# Patient Record
Sex: Male | Born: 1985 | Race: White | Hispanic: No | Marital: Single | Smoking: Never smoker
Health system: Southern US, Community
[De-identification: ages and names within clinical notes are randomized; demographics above are authoritative.]

## PROBLEM LIST (undated history)

## (undated) DIAGNOSIS — N200 Calculus of kidney: Secondary | ICD-10-CM

---

## 2009-04-23 ENCOUNTER — Emergency Department (HOSPITAL_COMMUNITY): Admission: EM | Admit: 2009-04-23 | Discharge: 2009-04-23 | Payer: Self-pay | Admitting: Emergency Medicine

## 2011-04-02 LAB — URINALYSIS, ROUTINE W REFLEX MICROSCOPIC
Glucose, UA: NEGATIVE mg/dL
Ketones, ur: NEGATIVE mg/dL
Protein, ur: 30 mg/dL — AB
pH: 7 (ref 5.0–8.0)

## 2011-04-02 LAB — URINE MICROSCOPIC-ADD ON

## 2018-04-06 ENCOUNTER — Emergency Department (HOSPITAL_COMMUNITY)
Admission: EM | Admit: 2018-04-06 | Discharge: 2018-04-06 | Disposition: A | Discharge status: Home / Self Care | Payer: 59 | Attending: Emergency Medicine | Admitting: Emergency Medicine

## 2018-04-06 ENCOUNTER — Other Ambulatory Visit: Payer: Self-pay

## 2018-04-06 ENCOUNTER — Encounter (HOSPITAL_COMMUNITY): Payer: Self-pay | Admitting: Emergency Medicine

## 2018-04-06 ENCOUNTER — Emergency Department (HOSPITAL_COMMUNITY): Payer: 59

## 2018-04-06 DIAGNOSIS — R109 Unspecified abdominal pain: Secondary | ICD-10-CM | POA: Diagnosis present

## 2018-04-06 DIAGNOSIS — N200 Calculus of kidney: Secondary | ICD-10-CM | POA: Insufficient documentation

## 2018-04-06 HISTORY — DX: Calculus of kidney: N20.0

## 2018-04-06 LAB — URINALYSIS, ROUTINE W REFLEX MICROSCOPIC
Bacteria, UA: NONE SEEN
Bilirubin Urine: NEGATIVE
GLUCOSE, UA: NEGATIVE mg/dL
Ketones, ur: NEGATIVE mg/dL
Leukocytes, UA: NEGATIVE
Nitrite: NEGATIVE
PH: 6 (ref 5.0–8.0)
Protein, ur: NEGATIVE mg/dL
Specific Gravity, Urine: 1.023 (ref 1.005–1.030)
Squamous Epithelial / LPF: NONE SEEN

## 2018-04-06 LAB — CBC WITH DIFFERENTIAL/PLATELET
Basophils Absolute: 0 10*3/uL (ref 0.0–0.1)
Basophils Relative: 0 %
Eosinophils Absolute: 0.1 10*3/uL (ref 0.0–0.7)
Eosinophils Relative: 1 %
HCT: 46 % (ref 39.0–52.0)
Hemoglobin: 15.5 g/dL (ref 13.0–17.0)
Lymphocytes Relative: 12 %
Lymphs Abs: 1.6 10*3/uL (ref 0.7–4.0)
MCH: 28.5 pg (ref 26.0–34.0)
MCHC: 33.7 g/dL (ref 30.0–36.0)
MCV: 84.6 fL (ref 78.0–100.0)
Monocytes Absolute: 1.3 10*3/uL — ABNORMAL HIGH (ref 0.1–1.0)
Monocytes Relative: 9 %
Neutro Abs: 11 10*3/uL — ABNORMAL HIGH (ref 1.7–7.7)
Neutrophils Relative %: 78 %
Platelets: 205 10*3/uL (ref 150–400)
RBC: 5.44 MIL/uL (ref 4.22–5.81)
RDW: 13.1 % (ref 11.5–15.5)
WBC: 14 10*3/uL — ABNORMAL HIGH (ref 4.0–10.5)

## 2018-04-06 LAB — BASIC METABOLIC PANEL
Anion gap: 10 (ref 5–15)
BUN: 14 mg/dL (ref 6–20)
CO2: 25 mmol/L (ref 22–32)
Calcium: 9.4 mg/dL (ref 8.9–10.3)
Chloride: 102 mmol/L (ref 101–111)
Creatinine, Ser: 1.21 mg/dL (ref 0.61–1.24)
GFR calc Af Amer: >60 mL/min (ref >60)
GFR calc non Af Amer: >60 mL/min (ref >60)
Glucose, Bld: 104 mg/dL — ABNORMAL HIGH (ref 65–99)
Potassium: 4.1 mmol/L (ref 3.5–5.1)
Sodium: 137 mmol/L (ref 135–145)

## 2018-04-06 MED ORDER — TAMSULOSIN HCL 0.4 MG PO CAPS: 0.4000 mg | ORAL_CAPSULE | Freq: Every day | ORAL | 0 refills | Status: DC

## 2018-04-06 MED ORDER — OXYCODONE-ACETAMINOPHEN 5-325 MG PO TABS: 1.0000 | ORAL_TABLET | Freq: Four times a day (QID) | ORAL | 0 refills | Status: DC | PRN

## 2018-04-06 MED ORDER — OXYCODONE-ACETAMINOPHEN 5-325 MG PO TABS
1.0000 | ORAL_TABLET | ORAL | Status: DC | PRN
  Administered 2018-04-06: 1 via ORAL
  Filled 2018-04-06: qty 1

## 2018-04-06 MED ORDER — KETOROLAC TROMETHAMINE 30 MG/ML IJ SOLN
30.0000 mg | Freq: Once | INTRAMUSCULAR | Status: AC
  Administered 2018-04-06: 30 mg via INTRAMUSCULAR
  Filled 2018-04-06: qty 1

## 2018-04-06 NOTE — ED Provider Notes (Signed)
[facility] Provider Note   CSN: [id number] Arrival date & time: 04/06/18  1231     History   Chief Complaint Chief Complaint  Patient presents with  . Flank Pain    HPI Todd Vazquez is a 32 y.o. male.  HPI   32 year old male with a history of nephrolithiasis presents today with complaints of flank pain.  Patient notes that last week he had left-sided flank pain blood in his urine and felt as if he had passed a stone.  Patient notes he was feeling better and then again had recurrence of symptoms this morning.  He notes this was severe in the left flank, denies any dysuria, but reports fullness in his bladder.  He denies any fever chills nausea or vomiting, denies any abdominal pain.  Patient notes this feels similar to previous history of stones.   Past Medical History:  Diagnosis Date  . Kidney calculi     There are no active problems to display for this patient.   History reviewed. No pertinent surgical history.      Home Medications    Prior to Admission medications   Medication Sig Start Date End Date Taking? Authorizing Provider  oxyCODONE-acetaminophen (PERCOCET/ROXICET) 5-325 MG tablet Take 1 tablet by mouth every 6 (six) hours as needed for severe pain. 04/06/18   Vanilla Heatherington, Tinnie GensJeffrey, PA-C  tamsulosin (FLOMAX) 0.4 MG CAPS capsule Take 1 capsule (0.4 mg total) by mouth daily. 04/06/18   Eyvonne MechanicHedges, Kalii Chesmore, PA-C    Family History No family history on file.  Social History Social History   Tobacco Use  . Smoking status: Never Smoker  Substance Use Topics  . Alcohol use: Never    Frequency: Never  . Drug use: Never     Allergies   Patient has no known allergies.   Review of Systems Review of Systems  All other systems reviewed and are negative.  Physical Exam Updated Vital Signs BP (!) 143/106   Pulse 82   Temp (!) 97.5 F (36.4 C) (Oral)   Resp 18   Ht 6\' 2"  (1.88 m)   Wt 127 kg (280 lb)   SpO2 98%   BMI  35.95 kg/m   Physical Exam  Constitutional: He is oriented to person, place, and time. He appears well-developed and well-nourished.  HENT:  Head: Normocephalic and atraumatic.  Eyes: Pupils are equal, round, and reactive to light. Conjunctivae are normal. Right eye exhibits no discharge. Left eye exhibits no discharge. No scleral icterus.  Neck: Normal range of motion. No JVD present. No tracheal deviation present.  Pulmonary/Chest: Effort normal. No stridor.  Abdominal: Soft. He exhibits no distension and no mass. There is no tenderness. There is no rebound and no guarding. No hernia.  No CVA tenderness  Neurological: He is alert and oriented to person, place, and time. Coordination normal.  Psychiatric: He has a normal mood and affect. His behavior is normal. Judgment and thought content normal.  Nursing note and vitals reviewed.    ED Treatments / Results  Labs (all labs ordered are listed, but only abnormal results are displayed) Labs Reviewed  URINALYSIS, ROUTINE W REFLEX MICROSCOPIC - Abnormal; Notable for the following components:      Result Value   APPearance HAZY (*)    Hgb urine dipstick LARGE (*)    All other components within normal limits  CBC WITH DIFFERENTIAL/PLATELET - Abnormal; Notable for the following components:   WBC 14.0 (*)    Neutro Abs  11.0 (*)    Monocytes Absolute 1.3 (*)    All other components within normal limits  BASIC METABOLIC PANEL - Abnormal; Notable for the following components:   Glucose, Bld 104 (*)    All other components within normal limits    EKG None  Radiology Ct Renal Stone Study  Result Date: 04/06/2018 CLINICAL DATA:  Left flank pain and dysuria EXAM: CT ABDOMEN AND PELVIS WITHOUT CONTRAST TECHNIQUE: Multidetector CT imaging of the abdomen and pelvis was performed following the standard protocol without oral or IV contrast. COMPARISON:  Apr 23, 2009 FINDINGS: Lower chest: Lung bases are clear. Hepatobiliary: There is hepatic  steatosis. No focal liver lesions are evident. Gallbladder wall is not appreciably thickened. There is no biliary duct dilatation. Pancreas: No pancreatic mass or inflammatory focus. Spleen: No splenic lesions are evident. Adrenals/Urinary Tract: Adrenals bilaterally appear normal. There is a horseshoe kidney. There is no renal mass. There is hydronephrosis of the left moiety of the horseshoe kidney. No hydronephrosis on the right. There is a 1 mm calculus in the left horseshoe kidney moiety superiorly. There is a 1 mm calculus more inferiorly in the left horseshoe kidney moiety. There is a 1 mm calculus in the lower portion of the right moiety region. There is ureterectasis on the left to the level of the ureteropelvic junction. At the proximal ureterovesical junction on the left, there is a 2 mm calculus. More distally in the ureterovesical junction region on the left, there is a 5 x 5 mm calculus. No other ureteral calculi are evident. Urinary bladder wall thickness is within normal limits. Stomach/Bowel: There is no appreciable bowel wall or mesenteric thickening. No evident bowel obstruction. No free air. Vascular/Lymphatic: No vascular lesion evident. No abdominal aortic aneurysm. No adenopathy is evident in the abdomen or pelvis. Reproductive: Prostate and seminal vesicles appear normal in size and contour. No pelvic mass evident. Other: Appendix appears normal. No abscess or ascites evident in the abdomen or pelvis. Musculoskeletal: There is an assimilation joint on the right at L5. No blastic or lytic bone lesions. No intramuscular or abdominal wall lesion evident. IMPRESSION: 1. Horseshoe kidney. Moderate hydronephrosis of the left renal moiety. There is also left-sided ureterectasis. At the ureterovesical junction distally on the left, there is a 5 x 5 mm calculus. More proximally at the left ureterovesical junction, there is a 2 mm calculus. 2. There are small nonobstructing calculi in each moiety of the  horseshoe kidney. 3.  No bowel obstruction.  No abscess.  Appendix appears normal. 4.  Hepatic steatosis.  No focal liver lesions evident. Electronically Signed   By: Bretta Bang III M.D.   On: 04/06/2018 15:21    Procedures Procedures (including critical care time)  Medications Ordered in ED Medications  oxyCODONE-acetaminophen (PERCOCET/ROXICET) 5-325 MG per tablet 1 tablet (1 tablet Oral Given 04/06/18 1343)  ketorolac (TORADOL) 30 MG/ML injection 30 mg (30 mg Intramuscular Given 04/06/18 1438)     Initial Impression / Assessment and Plan / ED Course  I have reviewed the triage vital signs and the nursing notes.  Pertinent labs & imaging results that were available during my care of the patient were reviewed by me and considered in my medical decision making (see chart for details).     Final Clinical Impressions(s) / ED Diagnoses   Final diagnoses:  Nephrolithiasis    Labs: CBC, BMP   Imaging: CT renal  Consults:  Therapeutics: Toradol, Percocet  Discharge Meds: Percocet, Flomax  Assessment/Plan: 32 year old  male presents today with ureterolithiasis.  Patient has 2 stones in the distal ureter close to the UVJ.  Patient has hydronephrosis, no signs of decreased GFR or signs of urinary tract infection.  Slight elevation in white count likely secondary to pain.  Abdomen soft nontender.  Patient's pain well controlled here, I do feel he is safe for outpatient follow-up, I have encouraged him to consult urology to schedule outpatient follow-up this week, he will return immediately with any new or worsening signs or symptoms including fever, nausea vomiting, decreased urine, pain, or any other concerning feature.  Both patient and his significant other verbalized understanding and agreement to today's plan had no further questions or concerns at the time of discharge.    ED Discharge Orders        Ordered    oxyCODONE-acetaminophen (PERCOCET/ROXICET) 5-325 MG tablet  Every  6 hours PRN     04/06/18 1559    tamsulosin (FLOMAX) 0.4 MG CAPS capsule  Daily     04/06/18 1601       Eyvonne Mechanic, PA-C 04/06/18 1643    Arby Barrette, MD 04/06/18 1811

## 2018-04-06 NOTE — Discharge Instructions (Signed)
Please read attached information. If you experience any new or worsening signs or symptoms please return to the emergency room for evaluation. Please follow-up with your primary care provider or specialist as discussed. Please use medication prescribed only as directed and discontinue taking if you have any concerning signs or symptoms.   °

## 2018-04-06 NOTE — ED Notes (Signed)
Patient transported to CT 

## 2018-04-06 NOTE — ED Triage Notes (Signed)
States history of kidney stones and passes them a lot.  Today approximately 2 hours ago developed left flank pain. 9/10 sharp. States does of dysuria burning sensation.

## 2018-04-06 NOTE — ED Provider Notes (Signed)
Patient placed in Quick Look pathway, seen and evaluated   Chief Complaint: Left flank pain   HPI:   Patient with a history of nephrolithiasis presenting with left flank pain radiating down the left groin, dysuria and urge to urinate but unable to void for the last 4 hours.  His pain is similar to prior stone. He has also passed a stone last week.  ROS: Denies testicular pain or swelling, no fever or chills, vomiting or diarrhea.  Physical Exam:   Gen: No distress  Neuro: Awake and Alert  Skin: Warm    Focused Exam: Afebrile, nontoxic-appearing, mild discomfort on palpation of the LLQ and lower back with left-sided CVA tenderness.   Initiation of care has begun. The patient has been counseled on the process, plan, and necessity for staying for the completion/evaluation, and the remainder of the medical screening examination    Todd Vazquez, Todd Zeek B, PA-C 04/06/18 1350    Todd Vazquez, Todd Lyn, MD 04/06/18 1538

## 2019-02-07 IMAGING — CT CT RENAL STONE PROTOCOL
2 of 4 series · 16 of 46 positions shown, 18 images · non-contrast
Comparison: April 23, 2009

CLINICAL DATA: Left flank pain and dysuria

EXAM:
CT ABDOMEN AND PELVIS WITHOUT CONTRAST
TECHNIQUE: Multidetector CT imaging of the abdomen and pelvis was performed
following the standard protocol without oral or IV contrast.

[Series 3: stone study 5.0 i30f 2 · axial · 0.98mm/px · z∈[+973,+1493]mm · 13 of 114 slices shown, 15 images]
[im 5/114  soft-tissue]
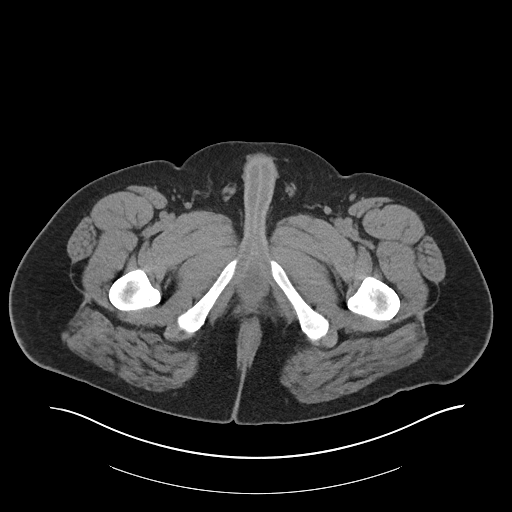
[im 5/114  bone]
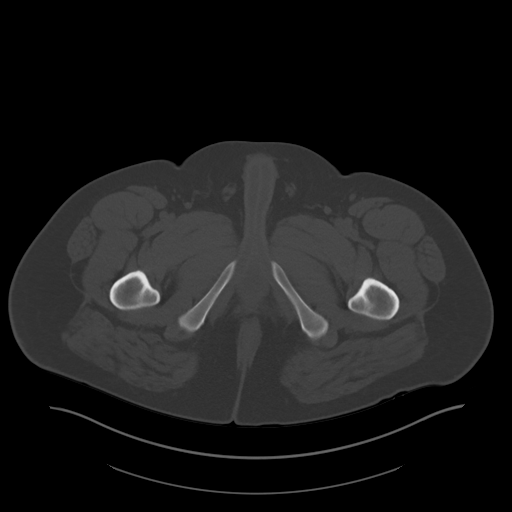
[im 15/114  soft-tissue]
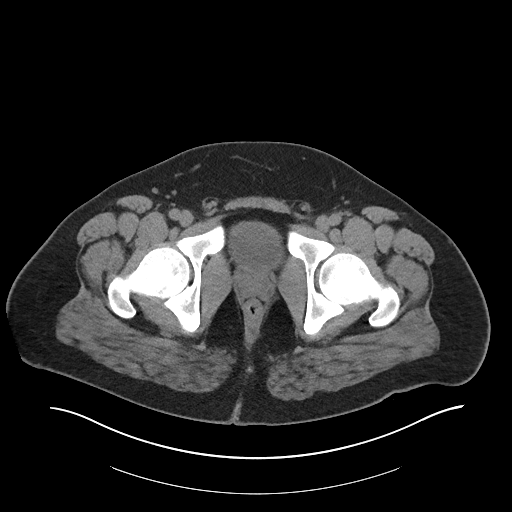
[im 25/114  soft-tissue]
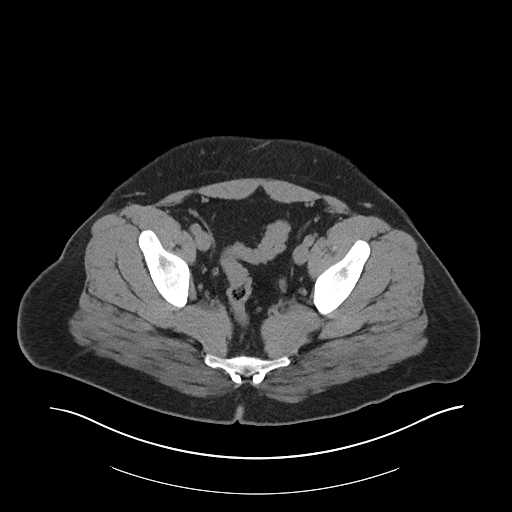
[im 30/114  soft-tissue]
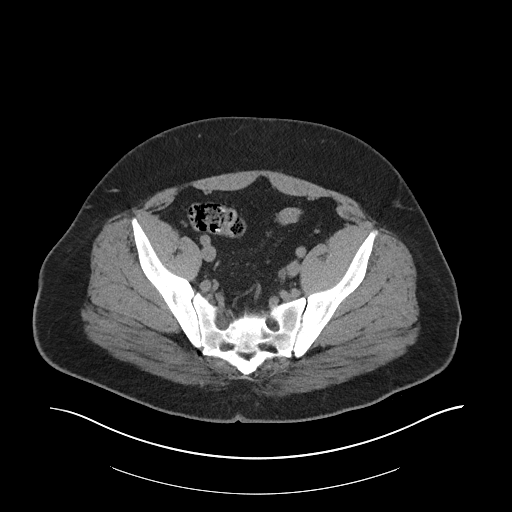
[im 40/114  soft-tissue]
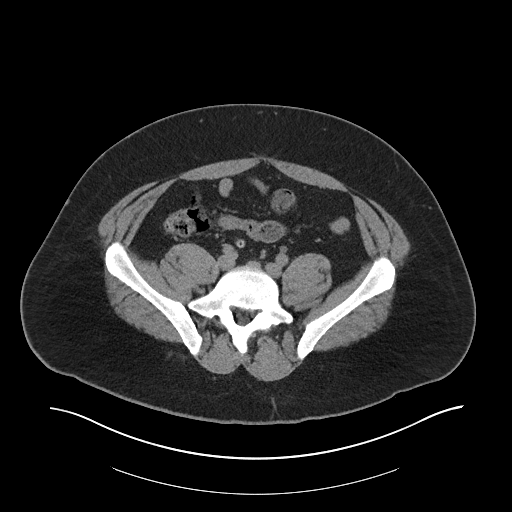
[im 50/114  soft-tissue]
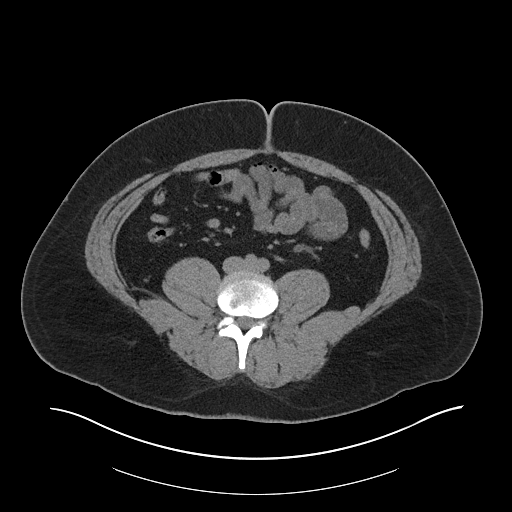
[im 59/114  soft-tissue]
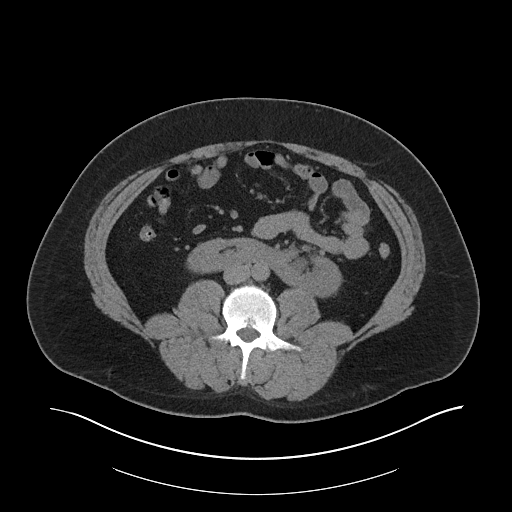
[im 64/114  soft-tissue]
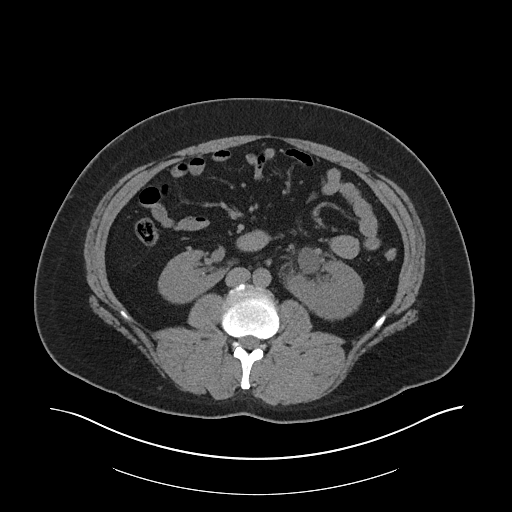
[im 74/114  soft-tissue]
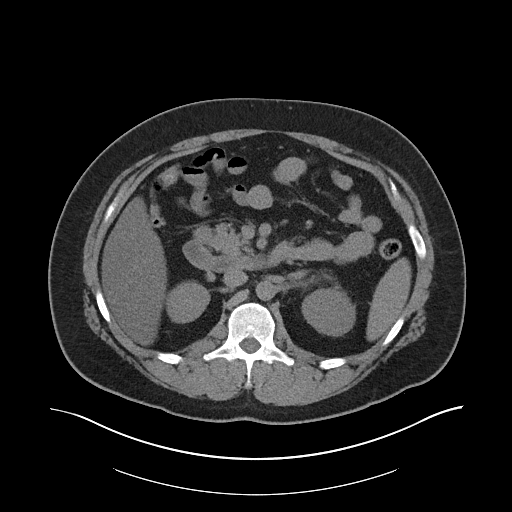
[im 74/114  bone]
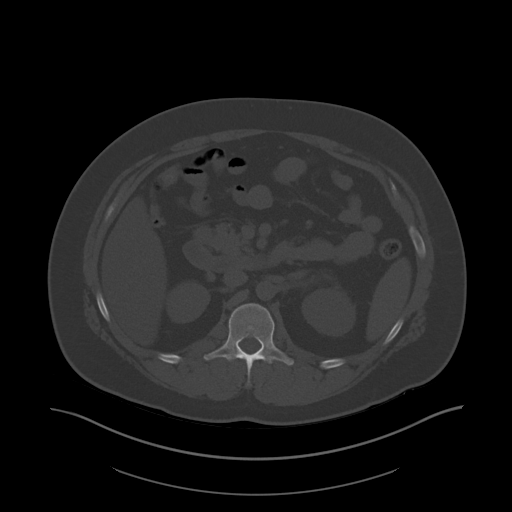
[im 84/114  soft-tissue]
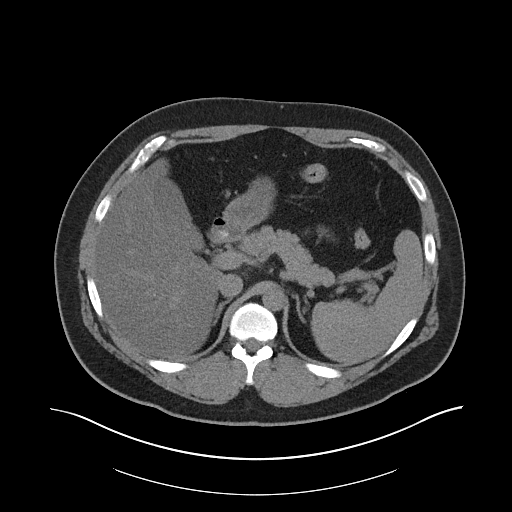
[im 89/114  soft-tissue]
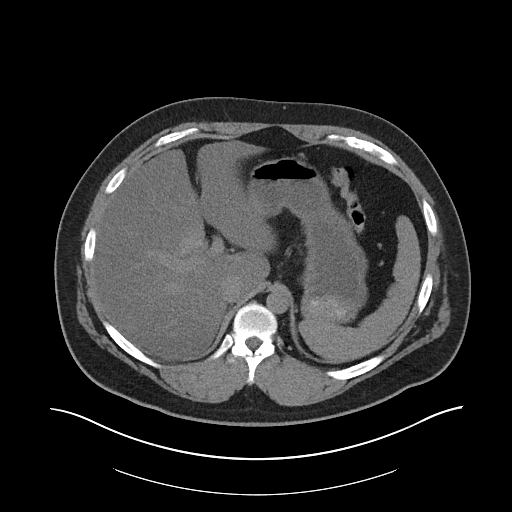
[im 99/114  soft-tissue]
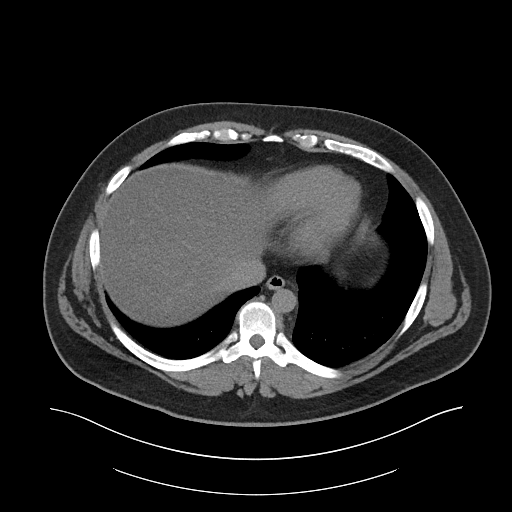
[im 109/114  soft-tissue]
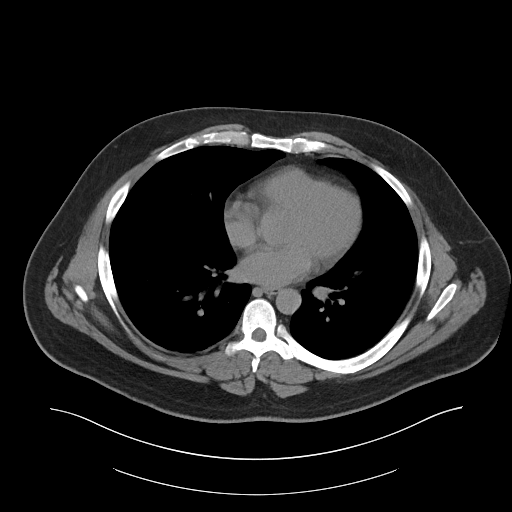

[Series 6: coronal soft tissue · coronal · 0.96mm/px · 3 of 109 slices shown]
[im 37/109  soft-tissue]
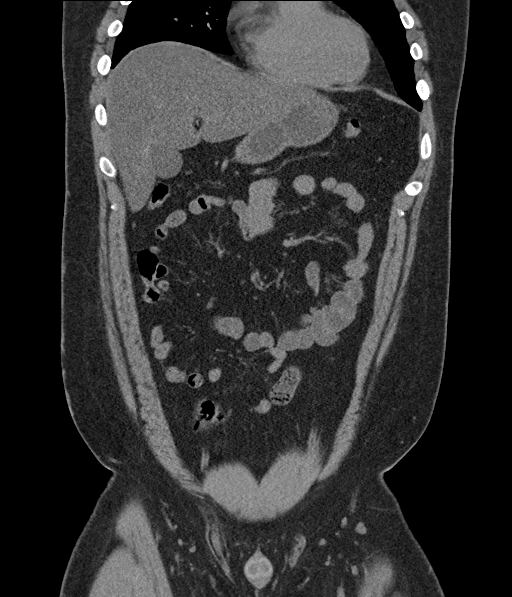
[im 49/109  soft-tissue]
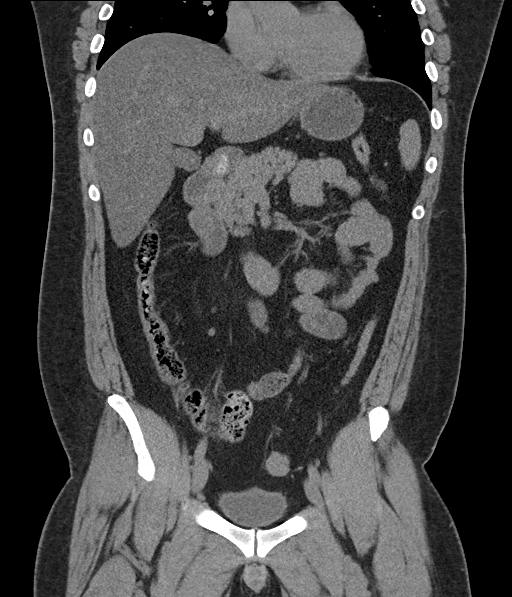
[im 61/109  soft-tissue]
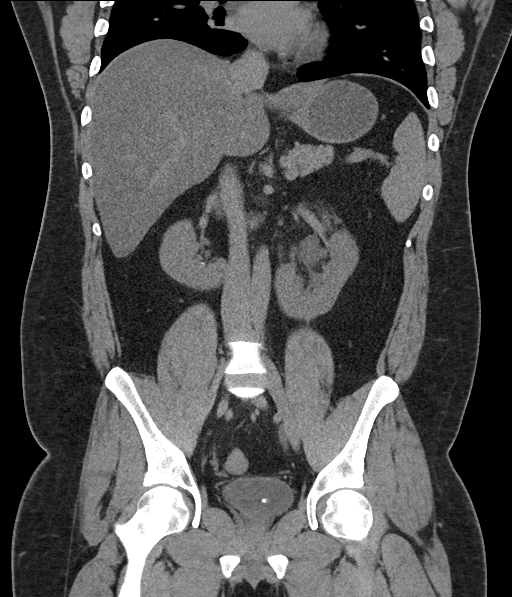

[16 of 46 positions shown; findings below may reference images not displayed]

FINDINGS: Lower chest: Lung bases are clear.

Hepatobiliary: There is hepatic steatosis. No focal liver lesions
are evident. Gallbladder wall is not appreciably thickened. There is
no biliary duct dilatation.

Pancreas: No pancreatic mass or inflammatory focus.

Spleen: No splenic lesions are evident.

Adrenals/Urinary Tract: Adrenals bilaterally appear normal. There is
a horseshoe kidney. There is no renal mass. There is hydronephrosis
of the left moiety of the horseshoe kidney. No hydronephrosis on the
right. There is a 1 mm calculus in the left horseshoe kidney moiety
superiorly. There is a 1 mm calculus more inferiorly in the left
horseshoe kidney moiety. There is a 1 mm calculus in the lower
portion of the right moiety region. There is ureterectasis on the
left to the level of the ureteropelvic junction. At the proximal
ureterovesical junction on the left, there is a 2 mm calculus. More
distally in the ureterovesical junction region on the left, there is
a 5 x 5 mm calculus. No other ureteral calculi are evident. Urinary
bladder wall thickness is within normal limits.

Stomach/Bowel: There is no appreciable bowel wall or mesenteric
thickening. No evident bowel obstruction. No free air.

Vascular/Lymphatic: No vascular lesion evident. No abdominal aortic
aneurysm. No adenopathy is evident in the abdomen or pelvis.

Reproductive: Prostate and seminal vesicles appear normal in size
and contour. No pelvic mass evident.

Other: Appendix appears normal. No abscess or ascites evident in the
abdomen or pelvis.

Musculoskeletal: There is an assimilation joint on the right at L5.
No blastic or lytic bone lesions. No intramuscular or abdominal wall
lesion evident.
IMPRESSION: 1. Horseshoe kidney. Moderate hydronephrosis of the left renal
moiety. There is also left-sided ureterectasis. At the
ureterovesical junction distally on the left, there is a 5 x 5 mm
calculus. More proximally at the left ureterovesical junction, there
is a 2 mm calculus.

2. There are small nonobstructing calculi in each moiety of the
horseshoe kidney.

3.  No bowel obstruction.  No abscess.  Appendix appears normal.

4.  Hepatic steatosis.  No focal liver lesions evident.

## 2020-10-20 ENCOUNTER — Encounter: Payer: Self-pay | Admitting: Emergency Medicine

## 2020-10-20 ENCOUNTER — Other Ambulatory Visit: Payer: Self-pay

## 2020-10-20 ENCOUNTER — Ambulatory Visit
Admission: EM | Admit: 2020-10-20 | Discharge: 2020-10-20 | Disposition: A | Discharge status: Home / Self Care | Payer: 59 | Attending: Emergency Medicine | Admitting: Emergency Medicine

## 2020-10-20 DIAGNOSIS — J029 Acute pharyngitis, unspecified: Secondary | ICD-10-CM | POA: Diagnosis not present

## 2020-10-20 DIAGNOSIS — J039 Acute tonsillitis, unspecified: Secondary | ICD-10-CM | POA: Diagnosis present

## 2020-10-20 LAB — POCT RAPID STREP A (OFFICE): Rapid Strep A Screen: NEGATIVE

## 2020-10-20 MED ORDER — MELOXICAM 15 MG PO TABS: 15.0000 mg | ORAL_TABLET | Freq: Every day | ORAL | 0 refills | Status: AC

## 2020-10-20 NOTE — ED Triage Notes (Signed)
Sore throat and swollen tonsils since Monday

## 2020-10-20 NOTE — ED Provider Notes (Signed)
[facility]   [id number] 10/20/20 Arrival Time: 1141   CC: COVID symptoms  SUBJECTIVE: History from: patient.  Todd Vazquez is a 34 y.o. male who presents with runny nose, congestion, cough, sore throat and swollen tonsil x 4 days.  Denies sick exposure to COVID, flu or strep.  Denies alleviating factors.  Symptoms are made worse with swallowing, but tolerating own secretions.  Denies previous symptoms in the past.   Denies fever, chills,  SOB, wheezing, chest pain, nausea, changes in bowel or bladder habits.    ROS: As per HPI.  All other pertinent ROS negative.     Past Medical History:  Diagnosis Date  . Kidney calculi    History reviewed. No pertinent surgical history. No Known Allergies No current facility-administered medications on file prior to encounter.   Current Outpatient Medications on File Prior to Encounter  Medication Sig Dispense Refill  . tamsulosin (FLOMAX) 0.4 MG CAPS capsule Take 1 capsule (0.4 mg total) by mouth daily. 30 capsule 0   Social History   Socioeconomic History  . Marital status: Single    Spouse name: Not on file  . Number of children: Not on file  . Years of education: Not on file  . Highest education level: Not on file  Occupational History  . Not on file  Tobacco Use  . Smoking status: Never Smoker  . Smokeless tobacco: Never Used  Substance and Sexual Activity  . Alcohol use: Never  . Drug use: Never  . Sexual activity: Not on file  Other Topics Concern  . Not on file  Social History Narrative  . Not on file   Social Determinants of Health   Financial Resource Strain:   . Difficulty of Paying Living Expenses: Not on file  Food Insecurity:   . Worried About Programme researcher, broadcasting/film/video in the Last Year: Not on file  . Ran Out of Food in the Last Year: Not on file  Transportation Needs:   . Lack of Transportation (Medical): Not on file  . Lack of Transportation (Non-Medical): Not on file  Physical Activity:   . Days of  Exercise per Week: Not on file  . Minutes of Exercise per Session: Not on file  Stress:   . Feeling of Stress : Not on file  Social Connections:   . Frequency of Communication with Friends and Family: Not on file  . Frequency of Social Gatherings with Friends and Family: Not on file  . Attends Religious Services: Not on file  . Active Member of Clubs or Organizations: Not on file  . Attends Banker Meetings: Not on file  . Marital Status: Not on file  Intimate Partner Violence:   . Fear of Current or Ex-Partner: Not on file  . Emotionally Abused: Not on file  . Physically Abused: Not on file  . Sexually Abused: Not on file   No family history on file.  OBJECTIVE:  Vitals:   10/20/20 1311 10/20/20 1324  BP: (!) 142/86   Pulse: 78   Resp: 16   Temp: 98.6 F (37 C)   TempSrc: Oral   SpO2: 97%   Weight:  280 lb (127 kg)  Height:  6\' 2"  (1.88 m)    General appearance: alert; appears fatigued, but nontoxic; speaking in full sentences and tolerating own secretions HEENT: NCAT; Ears: EACs clear, TMs pearly gray; Eyes: PERRL.  EOM grossly intact. Nose: nares patent without rhinorrhea, Throat: oropharynx clear, tonsils erythematous NOT enlarged, uvula  midline  Neck: supple without LAD Lungs: unlabored respirations, symmetrical air entry; cough: absent; no respiratory distress; CTAB Heart: regular rate and rhythm.   Skin: warm and dry Psychological: alert and cooperative; normal mood and affect  LABS:  Results for orders placed or performed during the hospital encounter of 10/20/20 (from the past 24 hour(s))  POCT rapid strep A     Status: None   Collection Time: 10/20/20  1:09 PM  Result Value Ref Range   Rapid Strep A Screen Negative Negative     ASSESSMENT & PLAN:  1. Sore throat   2. Acute tonsillitis, unspecified etiology     Meds ordered this encounter  Medications  . meloxicam (MOBIC) 15 MG tablet    Sig: Take 1 tablet (15 mg total) by mouth daily.      Dispense:  30 tablet    Refill:  0    Order Specific Question:   Supervising Provider    Answer:   Eustace Moore [[id number]]   Strep negative.  Culture sent COVID testing ordered.  It will take between 5-7 days for test results.  Someone will contact you regarding abnormal results.    In the meantime: You should remain isolated in your home for 10 days from symptom onset AND greater than 72 hours after symptoms resolution (absence of fever without the use of fever-reducing medication and improvement in respiratory symptoms), whichever is longer Get plenty of rest and push fluids Mobic prescribed for pain Use OTC zyrtec for nasal congestion, runny nose, and/or sore throat Use OTC flonase for nasal congestion and runny nose Use medications daily for symptom relief Call or go to the ED if you have any new or worsening symptoms such as fever, cough, shortness of breath, chest tightness, chest pain, turning blue, changes in mental status, etc...   Reviewed expectations re: course of current medical issues. Questions answered. Outlined signs and symptoms indicating need for more acute intervention. Patient verbalized understanding. After Visit Summary given.         Rennis Harding, PA-C 10/20/20 1352

## 2020-10-20 NOTE — Discharge Instructions (Signed)
Strep negative.  Culture sent COVID testing ordered.  It will take between 5-7 days for test results.  Someone will contact you regarding abnormal results.    In the meantime: You should remain isolated in your home for 10 days from symptom onset AND greater than 72 hours after symptoms resolution (absence of fever without the use of fever-reducing medication and improvement in respiratory symptoms), whichever is longer Get plenty of rest and push fluids Mobic prescribed for pain Use OTC zyrtec for nasal congestion, runny nose, and/or sore throat Use OTC flonase for nasal congestion and runny nose Use medications daily for symptom relief Call or go to the ED if you have any new or worsening symptoms such as fever, cough, shortness of breath, chest tightness, chest pain, turning blue, changes in mental status, etc..Marland Kitchen

## 2020-10-21 LAB — SARS-COV-2, NAA 2 DAY TAT

## 2020-10-21 LAB — NOVEL CORONAVIRUS, NAA: SARS-CoV-2, NAA: NOT DETECTED

## 2020-10-23 LAB — CULTURE, GROUP A STREP (THRC)

## 2022-12-19 ENCOUNTER — Emergency Department (HOSPITAL_BASED_OUTPATIENT_CLINIC_OR_DEPARTMENT_OTHER)
Admission: EM | Admit: 2022-12-19 | Discharge: 2022-12-19 | Disposition: A | Discharge status: Home / Self Care | Payer: 59 | Attending: Emergency Medicine | Admitting: Emergency Medicine

## 2022-12-19 ENCOUNTER — Other Ambulatory Visit: Payer: Self-pay

## 2022-12-19 ENCOUNTER — Encounter (HOSPITAL_BASED_OUTPATIENT_CLINIC_OR_DEPARTMENT_OTHER): Payer: Self-pay

## 2022-12-19 ENCOUNTER — Emergency Department (HOSPITAL_BASED_OUTPATIENT_CLINIC_OR_DEPARTMENT_OTHER): Payer: 59

## 2022-12-19 DIAGNOSIS — R109 Unspecified abdominal pain: Secondary | ICD-10-CM | POA: Diagnosis present

## 2022-12-19 DIAGNOSIS — N132 Hydronephrosis with renal and ureteral calculous obstruction: Secondary | ICD-10-CM | POA: Insufficient documentation

## 2022-12-19 DIAGNOSIS — N201 Calculus of ureter: Secondary | ICD-10-CM

## 2022-12-19 DIAGNOSIS — D72829 Elevated white blood cell count, unspecified: Secondary | ICD-10-CM | POA: Diagnosis not present

## 2022-12-19 DIAGNOSIS — R944 Abnormal results of kidney function studies: Secondary | ICD-10-CM | POA: Diagnosis not present

## 2022-12-19 DIAGNOSIS — I10 Essential (primary) hypertension: Secondary | ICD-10-CM | POA: Diagnosis not present

## 2022-12-19 LAB — CBC WITH DIFFERENTIAL/PLATELET
Abs Immature Granulocytes: 0.09 10*3/uL — ABNORMAL HIGH (ref 0.00–0.07)
Basophils Absolute: 0.1 10*3/uL (ref 0.0–0.1)
Basophils Relative: 0 %
Eosinophils Absolute: 0.1 10*3/uL (ref 0.0–0.5)
Eosinophils Relative: 1 %
HCT: 48.8 % (ref 39.0–52.0)
Hemoglobin: 16.5 g/dL (ref 13.0–17.0)
Immature Granulocytes: 1 %
Lymphocytes Relative: 17 %
Lymphs Abs: 2.2 10*3/uL (ref 0.7–4.0)
MCH: 28.4 pg (ref 26.0–34.0)
MCHC: 33.8 g/dL (ref 30.0–36.0)
MCV: 84 fL (ref 80.0–100.0)
Monocytes Absolute: 1 10*3/uL (ref 0.1–1.0)
Monocytes Relative: 8 %
Neutro Abs: 9.4 10*3/uL — ABNORMAL HIGH (ref 1.7–7.7)
Neutrophils Relative %: 73 %
Platelets: 267 10*3/uL (ref 150–400)
RBC: 5.81 MIL/uL (ref 4.22–5.81)
RDW: 12.6 % (ref 11.5–15.5)
WBC: 12.8 10*3/uL — ABNORMAL HIGH (ref 4.0–10.5)
nRBC: 0 % (ref 0.0–0.2)

## 2022-12-19 LAB — URINALYSIS, ROUTINE W REFLEX MICROSCOPIC
Glucose, UA: NEGATIVE mg/dL
Ketones, ur: NEGATIVE mg/dL
Leukocytes,Ua: NEGATIVE
Nitrite: NEGATIVE
Protein, ur: 100 mg/dL — AB
Specific Gravity, Urine: 1.025 (ref 1.005–1.030)
pH: 6 (ref 5.0–8.0)

## 2022-12-19 LAB — URINALYSIS, MICROSCOPIC (REFLEX): RBC / HPF: >50 RBC/hpf (ref 0–5)

## 2022-12-19 LAB — COMPREHENSIVE METABOLIC PANEL
ALT: 58 U/L — ABNORMAL HIGH (ref 0–44)
AST: 37 U/L (ref 15–41)
Albumin: 4.4 g/dL (ref 3.5–5.0)
Alkaline Phosphatase: 44 U/L (ref 38–126)
Anion gap: 6 (ref 5–15)
BUN: 19 mg/dL (ref 6–20)
CO2: 28 mmol/L (ref 22–32)
Calcium: 9.1 mg/dL (ref 8.9–10.3)
Chloride: 105 mmol/L (ref 98–111)
Creatinine, Ser: 1.26 mg/dL — ABNORMAL HIGH (ref 0.61–1.24)
GFR, Estimated: >60 mL/min (ref >60)
Glucose, Bld: 111 mg/dL — ABNORMAL HIGH (ref 70–99)
Potassium: 4.1 mmol/L (ref 3.5–5.1)
Sodium: 139 mmol/L (ref 135–145)
Total Bilirubin: 0.5 mg/dL (ref 0.3–1.2)
Total Protein: 7.6 g/dL (ref 6.5–8.1)

## 2022-12-19 LAB — LIPASE, BLOOD: Lipase: 37 U/L (ref 11–51)

## 2022-12-19 MED ORDER — KETOROLAC TROMETHAMINE 30 MG/ML IJ SOLN
15.0000 mg | Freq: Once | INTRAMUSCULAR | Status: AC
  Administered 2022-12-19: 15 mg via INTRAVENOUS
  Filled 2022-12-19: qty 1

## 2022-12-19 MED ORDER — IBUPROFEN 600 MG PO TABS: 600.0000 mg | ORAL_TABLET | Freq: Four times a day (QID) | ORAL | 0 refills | Status: DC | PRN

## 2022-12-19 MED ORDER — TAMSULOSIN HCL 0.4 MG PO CAPS
0.4000 mg | ORAL_CAPSULE | Freq: Once | ORAL | Status: AC
  Administered 2022-12-19: 0.4 mg via ORAL
  Filled 2022-12-19: qty 1

## 2022-12-19 MED ORDER — TAMSULOSIN HCL 0.4 MG PO CAPS: 0.4000 mg | ORAL_CAPSULE | Freq: Every day | ORAL | 0 refills | Status: DC

## 2022-12-19 MED ORDER — ONDANSETRON 4 MG PO TBDP: 4.0000 mg | ORAL_TABLET | Freq: Three times a day (TID) | ORAL | 0 refills | Status: AC | PRN

## 2022-12-19 MED ORDER — ONDANSETRON HCL 4 MG/2ML IJ SOLN
4.0000 mg | Freq: Once | INTRAMUSCULAR | Status: AC
  Administered 2022-12-19: 4 mg via INTRAVENOUS
  Filled 2022-12-19: qty 2

## 2022-12-19 MED ORDER — ONDANSETRON 4 MG PO TBDP: 4.0000 mg | ORAL_TABLET | Freq: Three times a day (TID) | ORAL | 0 refills | Status: DC | PRN

## 2022-12-19 MED ORDER — OXYCODONE HCL 5 MG PO TABS: 5.0000 mg | ORAL_TABLET | ORAL | 0 refills | Status: AC | PRN

## 2022-12-19 MED ORDER — IBUPROFEN 600 MG PO TABS: 600.0000 mg | ORAL_TABLET | Freq: Four times a day (QID) | ORAL | 0 refills | Status: AC | PRN

## 2022-12-19 MED ORDER — OXYCODONE-ACETAMINOPHEN 5-325 MG PO TABS
1.0000 | ORAL_TABLET | Freq: Once | ORAL | Status: AC
  Administered 2022-12-19: 1 via ORAL
  Filled 2022-12-19: qty 1

## 2022-12-19 MED ORDER — SODIUM CHLORIDE 0.9 % IV BOLUS
500.0000 mL | Freq: Once | INTRAVENOUS | Status: AC
  Administered 2022-12-19: 500 mL via INTRAVENOUS

## 2022-12-19 MED ORDER — TAMSULOSIN HCL 0.4 MG PO CAPS: 0.4000 mg | ORAL_CAPSULE | Freq: Every day | ORAL | 0 refills | Status: AC

## 2022-12-19 MED ORDER — OXYCODONE HCL 5 MG PO TABS: 5.0000 mg | ORAL_TABLET | ORAL | 0 refills | Status: DC | PRN

## 2022-12-19 NOTE — ED Notes (Signed)
Pt ambulatory to CT

## 2022-12-19 NOTE — ED Triage Notes (Signed)
Pt arrives arrives to ED POV c/o RT side flank pain since 2pm this afternoon. States blood in urine and has Hx of Kidney Stones Pain 10/10.

## 2022-12-19 NOTE — Discharge Instructions (Addendum)
Note the workup today was overall consistent with right-sided kidney stone.  Recommend taking daily Flomax as discussed as well as baseline pain ibuprofen with pain medication for breakthrough pain.  You can take Zofran as needed for nausea every 8 hours.  Recommend calling urology at your convenience to set up a follow-up appointment.  Please do not hesitate to return to emergency department for worrisome signs and symptoms we discussed become apparent.

## 2022-12-19 NOTE — ED Notes (Signed)
Discharge instructions reviewed with patient. Patient verbalizes understanding, no further questions at this time. Medications/prescriptions and follow up information provided. No acute distress noted at time of departure.  

## 2022-12-19 NOTE — ED Provider Notes (Signed)
[facility] EMERGENCY DEPARTMENT Provider Note   CSN: [id number] Arrival date & time: 12/19/22  1659     History  Chief Complaint  Patient presents with   Flank Pain    Todd Vazquez is a 36 y.o. male.   Flank Pain   36 year old male presents emergency department complaints of right-sided flank pain.  Patient notes acute onset of symptoms around 2 to 3 PM this afternoon.  Patient states that pain begins in his right lower back with radiation to his right inguinal region.  Reports history of kidney stones and states this feels similar.  Reports some feelings of urinary retention as well as hematuria without dysuria.  States has been able to pass stones unassisted in the past.  Denies fever, chills, night sweats, chest pain, shortness of breath.  Reports some associated nausea without emesis.  Past medical history significant for kidney stone  Home Medications Prior to Admission medications   Medication Sig Start Date End Date Taking? Authorizing Provider  ibuprofen (ADVIL) 600 MG tablet Take 1 tablet (600 mg total) by mouth every 6 (six) hours as needed. 12/19/22  Yes Sherian Maroon A, PA  ondansetron (ZOFRAN-ODT) 4 MG disintegrating tablet Take 1 tablet (4 mg total) by mouth every 8 (eight) hours as needed for nausea or vomiting. 12/19/22  Yes Sherian Maroon A, PA  oxyCODONE (ROXICODONE) 5 MG immediate release tablet Take 1 tablet (5 mg total) by mouth every 4 (four) hours as needed for severe pain. 12/19/22  Yes Sherian Maroon A, PA  tamsulosin (FLOMAX) 0.4 MG CAPS capsule Take 1 capsule (0.4 mg total) by mouth daily. 12/19/22  Yes Sherian Maroon A, PA  meloxicam (MOBIC) 15 MG tablet Take 1 tablet (15 mg total) by mouth daily. 10/20/20   Wurst, [place], PA-C      Allergies    Patient has no known allergies.    Review of Systems   Review of Systems  Genitourinary:  Positive for flank pain.  All other systems reviewed and are negative.   Physical Exam Updated  Vital Signs BP (!) 145/103 (BP Location: Right Arm)   Pulse 80   Temp (!) 97.2 F (36.2 C) (Oral)   Resp 20   Ht 6\' 2"  (1.88 m)   Wt 127 kg   SpO2 100%   BMI 35.95 kg/m  Physical Exam Vitals and nursing note reviewed.  Constitutional:      General: He is not in acute distress.    Appearance: He is well-developed.  HENT:     Head: Normocephalic and atraumatic.  Eyes:     Conjunctiva/sclera: Conjunctivae normal.  Cardiovascular:     Rate and Rhythm: Normal rate and regular rhythm.     Heart sounds: No murmur heard. Pulmonary:     Effort: Pulmonary effort is normal. No respiratory distress.     Breath sounds: Normal breath sounds.  Abdominal:     Palpations: Abdomen is soft.     Tenderness: There is no abdominal tenderness.     Comments: No anterior abdominal tenderness.  Right-sided CVA tenderness.  No overlying skin abnormalities appreciated.  Musculoskeletal:        General: No swelling.     Cervical back: Neck supple.  Skin:    General: Skin is warm and dry.     Capillary Refill: Capillary refill takes less than 2 seconds.  Neurological:     Mental Status: He is alert.  Psychiatric:        Mood and Affect: Mood  normal.     ED Results / Procedures / Treatments   Labs (all labs ordered are listed, but only abnormal results are displayed) Labs Reviewed  URINALYSIS, ROUTINE W REFLEX MICROSCOPIC - Abnormal; Notable for the following components:      Result Value   Color, Urine AMBER (*)    APPearance HAZY (*)    Hgb urine dipstick LARGE (*)    Bilirubin Urine SMALL (*)    Protein, ur 100 (*)    All other components within normal limits  COMPREHENSIVE METABOLIC PANEL - Abnormal; Notable for the following components:   Glucose, Bld 111 (*)    Creatinine, Ser 1.26 (*)    ALT 58 (*)    All other components within normal limits  CBC WITH DIFFERENTIAL/PLATELET - Abnormal; Notable for the following components:   WBC 12.8 (*)    Neutro Abs 9.4 (*)    Abs Immature  Granulocytes 0.09 (*)    All other components within normal limits  URINALYSIS, MICROSCOPIC (REFLEX) - Abnormal; Notable for the following components:   Bacteria, UA RARE (*)    All other components within normal limits  LIPASE, BLOOD    EKG None  Radiology CT Renal Stone Study  Result Date: 12/19/2022 CLINICAL DATA:  Acute right flank pain.  Gross hematuria. EXAM: CT ABDOMEN AND PELVIS WITHOUT CONTRAST TECHNIQUE: Multidetector CT imaging of the abdomen and pelvis was performed following the standard protocol without IV contrast. RADIATION DOSE REDUCTION: This exam was performed according to the departmental dose-optimization program which includes automated exposure control, adjustment of the mA and/or kV according to patient size and/or use of iterative reconstruction technique. COMPARISON:  April 06, 2018. FINDINGS: Lower chest: No acute abnormality. Hepatobiliary: Hepatic steatosis. No cholelithiasis or biliary dilatation is noted. Pancreas: Unremarkable. No pancreatic ductal dilatation or surrounding inflammatory changes. Spleen: Normal in size without focal abnormality. Adrenals/Urinary Tract: Adrenal glands appear normal. 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 Nonobstructive left nephrolithiasis is noted. Mild right hydroureteronephrosis is noted secondary to 5 mm calculus at right ureterovesical junction. Urinary bladder is decompressed. Stomach/Bowel: Stomach is within normal limits. Appendix appears normal. No evidence of bowel wall thickening, distention, or inflammatory changes. Vascular/Lymphatic: No significant vascular findings are present. No enlarged abdominal or pelvic lymph nodes. Reproductive: Prostate is unremarkable. Other: No abdominal wall hernia or abnormality. No abdominopelvic ascites. Musculoskeletal: No acute or significant osseous findings. IMPRESSION: Horseshoe configuration of kidneys is noted. Nonobstructive left nephrolithiasis. Mild right  hydroureteronephrosis is noted secondary to 5 mm calculus at right ureterovesical junction. Hepatic steatosis. Electronically Signed   By: Lupita Raider M.D.   On: 12/19/2022 18:25    Procedures Procedures    Medications Ordered in ED Medications  tamsulosin (FLOMAX) capsule 0.4 mg (has no administration in time range)  ketorolac (TORADOL) 30 MG/ML injection 15 mg (15 mg Intravenous Given 12/19/22 1800)  oxyCODONE-acetaminophen (PERCOCET/ROXICET) 5-325 MG per tablet 1 tablet (1 tablet Oral Given 12/19/22 1800)  ondansetron (ZOFRAN) injection 4 mg (4 mg Intravenous Given 12/19/22 1805)  sodium chloride 0.9 % bolus 500 mL (500 mLs Intravenous New Bag/Given 12/19/22 1831)    ED Course/ Medical Decision Making/ A&P                           Medical Decision Making Amount and/or Complexity of Data Reviewed Labs: ordered. Radiology: ordered.  Risk Prescription drug management.   This patient presents to the ED for concern of abdominal pain,  this involves an extensive number of treatment options, and is a complaint that carries with it a high risk of complications and morbidity.  The differential diagnosis includes nephrolithiasis, pyelonephritis, SBO/LBO, PUD/gastritis, GERD, CBD pathology, cholecystitis, hepatitis, diverticulitis, appendicitis, cystitis elevated to ischemia, AAA   Co morbidities that complicate the patient evaluation  See HPI   Additional history obtained:  Additional history obtained from EMR External records from outside source obtained and reviewed including hospital records   Lab Tests:  I Ordered, and personally interpreted labs.  The pertinent results include: Leukocytosis of 12.8.  No evidence of anemia.  Platelets normal range.  No electrolyte abnormalities appreciated.  Mild elevation of creatinine 1.26 from patient's baseline of 1.2-1 with no decrease in GFR.  BUN within normal limits.  No transaminitis appreciated.  UA significant for rare bacteria  with no nitrite or leukocyte with small bilirubin large hemoglobin with 100 protein.  Lipase within normal limits.   Imaging Studies ordered:  I ordered imaging studies including CT renal stone study I independently visualized and interpreted imaging which showed worsening figuration of kidneys.  Nonobstructing left nephrolithiasis.  Mild right hydro ureterolithiasis with 5 mm stone at the ureterovesicular junction.  Hepatic steatosis. I agree with the radiologist interpretation  Cardiac Monitoring: / EKG:  The patient was maintained on a cardiac monitor.  I personally viewed and interpreted the cardiac monitored which showed an underlying rhythm of: Sinus rhythm   Consultations Obtained:  N/a   Problem List / ED Course / Critical interventions / Medication management  Ureterolithiasis I ordered medication including Zofran for nausea, Toradol and oxycodone for pain.  Flomax  Reevaluation of the patient after these medicines showed that the patient improved I have reviewed the patients home medicines and have made adjustments as needed   Social Determinants of Health:  Denies  Illicit drug use   Test / Admission - Considered:  Ureterolithiasis Vitals signs significant for hypertension with blood pressure 145/103.  Recommend follow-up with primary care for reevaluation of blood pressure.. Otherwise within normal range and stable throughout visit. Laboratory/imaging studies significant for: See above Patient symptoms likely secondary to right-sided urolithiasis as indicated above.  No evidence of urinary tract infection or renal dysfunction from infected stone.  Patient responded well to oral pain medication administered while emergency department.  Outpatient management of the same with Flomax, ibuprofen and oxycodone as needed.  Recommend follow-up with urology for reevaluation of symptoms.  Treatment plan discussed at length with patient and he acknowledged understanding was  agreeable to said plan. Worrisome signs and symptoms were discussed with the patient, and the patient acknowledged understanding to return to the ED if noticed. Patient was stable upon discharge.          Final Clinical Impression(s) / ED Diagnoses Final diagnoses:  Ureterolithiasis    Rx / DC Orders ED Discharge Orders          Ordered    ibuprofen (ADVIL) 600 MG tablet  Every 6 hours PRN        12/19/22 1852    oxyCODONE (ROXICODONE) 5 MG immediate release tablet  Every 4 hours PRN        12/19/22 1852    ondansetron (ZOFRAN-ODT) 4 MG disintegrating tablet  Every 8 hours PRN        12/19/22 1852    tamsulosin (FLOMAX) 0.4 MG CAPS capsule  Daily        12/19/22 1852  Peter GarterRobbins, Winta Barcelo A, [place]PA 12/19/22 Carlis [id number]    Rolan BuccoBelfi, Melanie, MD 12/19/22 2308

## 2023-12-01 ENCOUNTER — Emergency Department (HOSPITAL_BASED_OUTPATIENT_CLINIC_OR_DEPARTMENT_OTHER): Payer: 59 | Admitting: Radiology

## 2023-12-01 ENCOUNTER — Emergency Department (HOSPITAL_BASED_OUTPATIENT_CLINIC_OR_DEPARTMENT_OTHER)
Admission: EM | Admit: 2023-12-01 | Discharge: 2023-12-01 | Disposition: A | Discharge status: Home / Self Care | Payer: 59

## 2023-12-01 ENCOUNTER — Encounter (HOSPITAL_BASED_OUTPATIENT_CLINIC_OR_DEPARTMENT_OTHER): Payer: Self-pay

## 2023-12-01 ENCOUNTER — Other Ambulatory Visit: Payer: Self-pay

## 2023-12-01 DIAGNOSIS — R002 Palpitations: Secondary | ICD-10-CM | POA: Insufficient documentation

## 2023-12-01 DIAGNOSIS — R059 Cough, unspecified: Secondary | ICD-10-CM | POA: Diagnosis not present

## 2023-12-01 DIAGNOSIS — R0602 Shortness of breath: Secondary | ICD-10-CM | POA: Insufficient documentation

## 2023-12-01 DIAGNOSIS — R0789 Other chest pain: Secondary | ICD-10-CM | POA: Diagnosis not present

## 2023-12-01 DIAGNOSIS — R202 Paresthesia of skin: Secondary | ICD-10-CM | POA: Insufficient documentation

## 2023-12-01 DIAGNOSIS — R079 Chest pain, unspecified: Secondary | ICD-10-CM | POA: Diagnosis present

## 2023-12-01 LAB — CBC WITH DIFFERENTIAL/PLATELET
Abs Immature Granulocytes: 0.06 10*3/uL (ref 0.00–0.07)
Basophils Absolute: 0.1 10*3/uL (ref 0.0–0.1)
Basophils Relative: 1 %
Eosinophils Absolute: 0.2 10*3/uL (ref 0.0–0.5)
Eosinophils Relative: 2 %
HCT: 48.9 % (ref 39.0–52.0)
Hemoglobin: 16.4 g/dL (ref 13.0–17.0)
Immature Granulocytes: 1 %
Lymphocytes Relative: 27 %
Lymphs Abs: 2.9 10*3/uL (ref 0.7–4.0)
MCH: 28 pg (ref 26.0–34.0)
MCHC: 33.5 g/dL (ref 30.0–36.0)
MCV: 83.4 fL (ref 80.0–100.0)
Monocytes Absolute: 0.9 10*3/uL (ref 0.1–1.0)
Monocytes Relative: 8 %
Neutro Abs: 6.5 10*3/uL (ref 1.7–7.7)
Neutrophils Relative %: 61 %
Platelets: 255 10*3/uL (ref 150–400)
RBC: 5.86 MIL/uL — ABNORMAL HIGH (ref 4.22–5.81)
RDW: 12.5 % (ref 11.5–15.5)
WBC: 10.6 10*3/uL — ABNORMAL HIGH (ref 4.0–10.5)
nRBC: 0 % (ref 0.0–0.2)

## 2023-12-01 LAB — COMPREHENSIVE METABOLIC PANEL
ALT: 83 U/L — ABNORMAL HIGH (ref 0–44)
AST: 57 U/L — ABNORMAL HIGH (ref 15–41)
Albumin: 4.6 g/dL (ref 3.5–5.0)
Alkaline Phosphatase: 47 U/L (ref 38–126)
Anion gap: 11 (ref 5–15)
BUN: 12 mg/dL (ref 6–20)
CO2: 23 mmol/L (ref 22–32)
Calcium: 9.8 mg/dL (ref 8.9–10.3)
Chloride: 103 mmol/L (ref 98–111)
Creatinine, Ser: 0.91 mg/dL (ref 0.61–1.24)
GFR, Estimated: >60 mL/min (ref >60)
Glucose, Bld: 99 mg/dL (ref 70–99)
Potassium: 3.9 mmol/L (ref 3.5–5.1)
Sodium: 137 mmol/L (ref 135–145)
Total Bilirubin: 0.7 mg/dL (ref <1.2)
Total Protein: 7.9 g/dL (ref 6.5–8.1)

## 2023-12-01 LAB — TROPONIN I (HIGH SENSITIVITY)
Troponin I (High Sensitivity): 4 ng/L (ref <18)
Troponin I (High Sensitivity): 3 ng/L (ref <18)

## 2023-12-01 LAB — D-DIMER, QUANTITATIVE: D-Dimer, Quant: <0.27 ug{FEU}/mL (ref 0.00–0.50)

## 2023-12-01 NOTE — ED Provider Triage Note (Signed)
Emergency Medicine Provider Triage Evaluation Note  Jecory Voce , a 37 y.o. male  was evaluated in triage.  Pt complains of chest pain that radiates into his back, arms, and jaw.  He reports his left arm will sometimes go numb.  His HR notified him of elevated HR today.  He has been feeling similar symptoms for the past 30 days, but it was worse today.  Endorses shortness of breath with it also.  Denies hx of heart problems.  He does not take any prescription medications  Review of Systems  Positive: As above Negative: As above  Physical Exam  BP (!) 146/100 (BP Location: Right Arm)   Pulse (!) 107   Temp 98.5 F (36.9 C)   Resp 18   SpO2 97%  Gen:   Awake, no distress   Resp:  Normal effort  MSK:   Moves extremities without difficulty  Other:  Mild tachycardia   Medical Decision Making  Medically screening exam initiated at 4:12 PM.  Appropriate orders placed.  Decklan Escalante was informed that the remainder of the evaluation will be completed by another provider, this initial triage assessment does not replace that evaluation, and the importance of remaining in the ED until their evaluation is complete.     Melton Alar R, [place] 12/01/23 [phone]

## 2023-12-01 NOTE — Discharge Instructions (Addendum)
It was a pleasure taking care of you this evening.  You were evaluated in the emergency room for chest pain and shortness of breath.  Your lab work did not show any significant abnormality, your chest x-ray was normal.  You were provided resources to establish care with a primary care provider.  You are also provided contact information for cardiologist in the area.  Please call make an appointment by the end of the week with either.  Should you experience any new or worsening symptoms including worsening persistent chest pain, dizziness, shortness of breath please return to the emergency room.

## 2023-12-01 NOTE — ED Triage Notes (Signed)
Pt advises "my watch told me I had a high HR, I'm not sure it ever stabilized. I have a pain in chest, back, arms, & teeth. My L arm will go numb sometimes too." Advises same symptoms x30 days, advises this is "the worst yet."  Onset earlier today, "I haven't felt well all day, been treating a HA." No other symptoms.

## 2023-12-01 NOTE — ED Provider Notes (Signed)
[place] EMERGENCY DEPARTMENT AT [facility] Provider Note   CSN: [id number] Arrival date & time: 12/01/23  1558     History  Chief Complaint  Patient presents with   Palpitations    Todd Vazquez is a 37 y.o. male with history of OSA and nephrolithiasis presents with complaints of chest pain, shortness of breath and numbness sensation going down his left upper extremity.  States he was sitting at work when his watch told him his heart rate was elevated at rest, noted to be maintained from 110-130.  States he felt clammy, shortness of breath, and central to left-sided chest pain that radiates to his back.  Pain has come and gone since 2 PM today.  Reports he has felt intermittently short of breath over the past few weeks for activities that he would otherwise have no difficulty with.  Notes that he had been sick a few weeks ago and still does have a little bit of residual dry cough.  He has no history of blood clot.  Denies any calf pain or swelling, recent surgery or extended travel.  No cardiac history.   Palpitations     Past Medical History:  Diagnosis Date   Kidney calculi      Home Medications Prior to Admission medications   Medication Sig Start Date End Date Taking? Authorizing Provider  ibuprofen (ADVIL) 600 MG tablet Take 1 tablet (600 mg total) by mouth every 6 (six) hours as needed. 12/19/22   Peter Garter, PA  meloxicam (MOBIC) 15 MG tablet Take 1 tablet (15 mg total) by mouth daily. 10/20/20   Wurst, [place], PA-C  ondansetron (ZOFRAN-ODT) 4 MG disintegrating tablet Take 1 tablet (4 mg total) by mouth every 8 (eight) hours as needed for nausea or vomiting. 12/19/22   Sherian Maroon A, PA  oxyCODONE (ROXICODONE) 5 MG immediate release tablet Take 1 tablet (5 mg total) by mouth every 4 (four) hours as needed for severe pain. 12/19/22   Peter Garter, PA  tamsulosin (FLOMAX) 0.4 MG CAPS capsule Take 1 capsule (0.4 mg total) by mouth daily. 12/19/22    Peter Garter, PA      Allergies    Patient has no known allergies.    Review of Systems   Review of Systems  Cardiovascular:  Positive for palpitations.    Physical Exam Updated Vital Signs BP 115/78   Pulse 86   Temp 98.1 F (36.7 C) (Oral)   Resp 18   SpO2 98%  Physical Exam Vitals and nursing note reviewed.  Constitutional:      General: He is not in acute distress.    Appearance: He is well-developed.  HENT:     Head: Normocephalic and atraumatic.  Eyes:     Conjunctiva/sclera: Conjunctivae normal.  Cardiovascular:     Rate and Rhythm: Normal rate and regular rhythm.     Heart sounds: No murmur heard. Pulmonary:     Effort: Pulmonary effort is normal. No respiratory distress.     Breath sounds: Normal breath sounds.  Chest:     Chest wall: No tenderness.  Abdominal:     Palpations: Abdomen is soft.     Tenderness: There is no abdominal tenderness.  Musculoskeletal:        General: No swelling or tenderness.     Cervical back: Neck supple.  Skin:    General: Skin is warm and dry.     Capillary Refill: Capillary refill takes less than 2 seconds.  Neurological:  General: No focal deficit present.     Mental Status: He is alert and oriented to person, place, and time.     Cranial Nerves: No cranial nerve deficit.     Motor: No weakness.     Coordination: Coordination normal.  Psychiatric:        Mood and Affect: Mood normal.     ED Results / Procedures / Treatments   Labs (all labs ordered are listed, but only abnormal results are displayed) Labs Reviewed  CBC WITH DIFFERENTIAL/PLATELET - Abnormal; Notable for the following components:      Result Value   WBC 10.6 (*)    RBC 5.86 (*)    All other components within normal limits  COMPREHENSIVE METABOLIC PANEL - Abnormal; Notable for the following components:   AST 57 (*)    ALT 83 (*)    All other components within normal limits  D-DIMER, QUANTITATIVE  TROPONIN I (HIGH SENSITIVITY)   TROPONIN I (HIGH SENSITIVITY)    EKG None  Radiology DG Chest 2 View  Result Date: 12/01/2023 CLINICAL DATA:  Chest pain and shortness of breath. EXAM: CHEST - 2 VIEW COMPARISON:  None Available. FINDINGS: The heart size and mediastinal contours are within normal limits. Both lungs are clear. The visualized skeletal structures are unremarkable. IMPRESSION: No active cardiopulmonary disease. Electronically Signed   By: Annia Belt M.D.   On: 12/01/2023 16:41    Procedures Procedures    Medications Ordered in ED Medications - No data to display  ED Course/ Medical Decision Making/ A&P                                 Medical Decision Making  This patient presents to the ED with chief complaint(s) of chest pain .  The complaint involves an extensive differential diagnosis and also carries with it a high risk of complications and morbidity.   pertinent past medical history as listed in HPI  The differential diagnosis includes  ACS, PE, AAA, GERD, pneumonia, costochondritis, cervical stenosis, cubital versus carpal tunnel, CVA The initial plan is to  Will obtain basic labs, chest x-ray EKG Additional history obtained: Additional history obtained from spouse Records reviewed previous admission documents  Initial Assessment:   Patient is hemodynamically stable.  Currently heart rate of 87.  He is sitting comfortably in bed in no acute distress.  He has no reproducible chest pain.  Lungs sound clear.  He has no PE risk factors.   Independent ECG interpretation:  Sinus rhythm with sinus arhythmia, no ischemic changes  Independent labs interpretation:  The following labs were independently interpreted:  CMP with mild transaminitis, CBC with very mild leukocytosis of 10.6.  Initial troponin without elevation.  Repeat troponin also without elevation.  D-dimer within normal limits.  Independent visualization and interpretation of imaging: I independently visualized the following  imaging with scope of interpretation limited to determining acute life threatening conditions related to emergency care: CXR, which revealed   Treatment and Reassessment: No medications administered during visit   Plan very evaluation 9:15 PM, patient is without complaints.  Discussed unremarkable workup and discharge plan with him.  He is agreeable.  Consultations obtained:   none  Disposition:   Patient will be discharged home.  Provided PCP resources to establish care.  Additionally provided contact information to follow-up with cardiology by the end of the week. The patient has been appropriately medically screened and/or stabilized in  the ED. I have low suspicion for any other emergent medical condition which would require further screening, evaluation or treatment in the ED or require inpatient management. At time of discharge the patient is hemodynamically stable and in no acute distress. I have discussed work-up results and diagnosis with patient and answered all questions. Patient is agreeable with discharge plan. We discussed strict return precautions for returning to the emergency department and they verbalized understanding.     Social Determinants of Health:   none  This note was dictated with voice recognition software.  Despite best efforts at proofreading, errors may have occurred which can change the documentation meaning.          Final Clinical Impression(s) / ED Diagnoses Final diagnoses:  Atypical chest pain    Rx / DC Orders ED Discharge Orders     None         Fabienne Bruns 12/01/23 2135    Durwin Glaze, MD 12/02/23 [phone]

## 2024-02-20 ENCOUNTER — Encounter: Payer: Self-pay | Admitting: Cardiology

## 2024-03-04 ENCOUNTER — Ambulatory Visit: Payer: 59 | Admitting: Cardiology

## 2024-05-14 ENCOUNTER — Ambulatory Visit: Admitting: Cardiology
